# Patient Record
Sex: Male | Born: 1985 | Race: White | Hispanic: No | Marital: Married | Smoking: Current every day smoker
Health system: Southern US, Community
[De-identification: ages and names within clinical notes are randomized; demographics above are authoritative.]

## PROBLEM LIST (undated history)

## (undated) HISTORY — PX: FRACTURE SURGERY: SHX138

## (undated) HISTORY — PX: APPENDECTOMY: SHX54

---

## 2011-07-04 ENCOUNTER — Ambulatory Visit: Payer: Self-pay

## 2012-06-16 ENCOUNTER — Ambulatory Visit

## 2012-06-16 ENCOUNTER — Ambulatory Visit (INDEPENDENT_AMBULATORY_CARE_PROVIDER_SITE_OTHER): Admitting: Internal Medicine

## 2012-06-16 VITALS — BP 112/72 | HR 66 | Temp 98.4°F | Resp 16 | Ht 69.5 in | Wt 198.0 lb

## 2012-06-16 DIAGNOSIS — M79609 Pain in unspecified limb: Secondary | ICD-10-CM

## 2012-06-16 DIAGNOSIS — M7750 Other enthesopathy of unspecified foot: Secondary | ICD-10-CM

## 2012-06-16 DIAGNOSIS — M7670 Peroneal tendinitis, unspecified leg: Secondary | ICD-10-CM

## 2012-06-16 DIAGNOSIS — M79671 Pain in right foot: Secondary | ICD-10-CM

## 2012-06-16 MED ORDER — MELOXICAM 7.5 MG PO TABS: 7.5000 mg | ORAL_TABLET | Freq: Two times a day (BID) | ORAL | Status: DC | PRN

## 2012-06-16 NOTE — Patient Instructions (Addendum)
Pt to do ice massage.  Take NSAIDs and continue wearing good supportive shoes.  If pain gets worse RTC or if no improvement in 10 days.

## 2012-06-16 NOTE — Progress Notes (Signed)
   [address] [place] [address]   Phone [phone]  Subjective:    Patient ID: Brendan James, male    DOB: September 06, 1985, 26 y.o.   MRN: [id number]  HPI  Pt presents to clinic with R foot pain for 48h.  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  The pain in on the other foot with some radiation the last day or so around to the achilles tendon.  He has taken no meds and used no ice.  He had no particular injury that he knows of during the run.  Wore his normal running tennis shoes.  He typically will run 2 miles but on Sat he ran 3 miles before he started to develop the pain. No pain when he is sitting still, but it really hurt this am to put on his boot.    Review of Systems  Musculoskeletal: Positive for joint swelling and gait problem (2nd to pain).       Objective:   Physical Exam  Constitutional: He is oriented to person, place, and time. He appears well-developed and well-nourished.  HENT:  Head: Normocephalic and atraumatic.  Right Ear: External ear normal.  Left Ear: External ear normal.  Pulmonary/Chest: Effort normal.  Musculoskeletal:       Feet:  Neurological: He is alert and oriented to person, place, and time.  Skin: Skin is warm and dry.  Psychiatric: He has a normal mood and affect. His behavior is normal. Judgment and thought content normal.    [facility] reading (PRIMARY) by  Dr. Merla Riches.  Normal xray.       Assessment & Plan:   1. Right foot pain  DG Foot Complete Right  2. Peroneal tendonitis  meloxicam (MOBIC) 7.5 MG tablet   Pt to use ice and if pain worsens he should RTC for camwalker but will try conservative treatment at this time.  Paperwork filled out for patient because this happened during a training weekend for reserves.  Answered patients questions.

## 2012-06-16 NOTE — Progress Notes (Signed)
D/w Dr Merla Riches.

## 2012-06-17 ENCOUNTER — Encounter: Payer: Self-pay | Admitting: Internal Medicine

## 2012-10-10 ENCOUNTER — Encounter: Payer: Self-pay | Admitting: Family Medicine

## 2014-07-02 ENCOUNTER — Emergency Department (HOSPITAL_BASED_OUTPATIENT_CLINIC_OR_DEPARTMENT_OTHER)
Admission: EM | Admit: 2014-07-02 | Discharge: 2014-07-02 | Disposition: A | Discharge status: Home / Self Care | Attending: Emergency Medicine | Admitting: Emergency Medicine

## 2014-07-02 ENCOUNTER — Encounter (HOSPITAL_BASED_OUTPATIENT_CLINIC_OR_DEPARTMENT_OTHER): Payer: Self-pay

## 2014-07-02 DIAGNOSIS — Y9289 Other specified places as the place of occurrence of the external cause: Secondary | ICD-10-CM | POA: Insufficient documentation

## 2014-07-02 DIAGNOSIS — Y998 Other external cause status: Secondary | ICD-10-CM | POA: Insufficient documentation

## 2014-07-02 DIAGNOSIS — W260XXA Contact with knife, initial encounter: Secondary | ICD-10-CM | POA: Diagnosis not present

## 2014-07-02 DIAGNOSIS — Z87891 Personal history of nicotine dependence: Secondary | ICD-10-CM | POA: Insufficient documentation

## 2014-07-02 DIAGNOSIS — Y9389 Activity, other specified: Secondary | ICD-10-CM | POA: Insufficient documentation

## 2014-07-02 DIAGNOSIS — S61512A Laceration without foreign body of left wrist, initial encounter: Secondary | ICD-10-CM | POA: Diagnosis present

## 2014-07-02 MED ORDER — LIDOCAINE-EPINEPHRINE (PF) 2 %-1:200000 IJ SOLN
10.0000 mL | Freq: Once | INTRAMUSCULAR | Status: AC
  Administered 2014-07-02: 10 mL
  Filled 2014-07-02: qty 20

## 2014-07-02 NOTE — ED Notes (Signed)
PA at bedside, suturing patient. 

## 2014-07-02 NOTE — ED Provider Notes (Signed)
CSN: [id number]     Arrival date & time 07/02/14  1636 History   First MD Initiated Contact with Patient 07/02/14 1828     Chief Complaint  Patient presents with  . Extremity Laceration     (Consider location/radiation/quality/duration/timing/severity/associated sxs/prior Treatment) HPI Comments: Patient presents today with a laceration to the volar aspect of the left wrist.  He reports that just prior to arrival he cut his wrist with a knife while opening a present.  Bleeding controlled.  Tetanus UTD.  No numbness or tingling.  He reports full ROM of the wrist.  No treatment prior to arrival.    The history is provided by the patient.    History reviewed. No pertinent past medical history. Past Surgical History  Procedure Laterality Date  . Fracture surgery    . Appendectomy     No family history on file. History  Substance Use Topics  . Smoking status: Former Games developermoker  . Smokeless tobacco: Not on file  . Alcohol Use: No    Review of Systems  All other systems reviewed and are negative.     Allergies  Review of patient's allergies indicates no known allergies.  Home Medications   Prior to Admission medications   Medication Sig Start Date End Date Taking? Authorizing Provider  meloxicam (MOBIC) 7.5 MG tablet Take 1 tablet (7.5 mg total) by mouth 2 (two) times daily as needed for pain. 06/16/12   Morrell RiddleSarah L Weber, PA-C   BP 124/66 mmHg  Pulse 61  Temp(Src) 98.4 F (36.9 C) (Oral)  Resp 18  Ht 5\' 7"  (1.702 m)  Wt 185 lb (83.915 kg)  BMI 28.97 kg/m2 Physical Exam  Constitutional: He appears well-developed and well-nourished.  HENT:  Head: Normocephalic and atraumatic.  Neck: Normal range of motion. Neck supple.  Cardiovascular: Normal rate, regular rhythm and normal heart sounds.   Pulses:      Radial pulses are 2+ on the right side, and 2+ on the left side.  Pulmonary/Chest: Effort normal and breath sounds normal.  Musculoskeletal: Normal range of motion.   Left wrist: He exhibits normal range of motion, no tenderness, no bony tenderness and no swelling.  Neurological: He is alert.  Distal sensation of the left fingers intact Muscle strength 5/5 with flexion and extension of the left wrist.    Skin: Skin is warm and dry.  1.5 cm linear superficial laceration of the volar aspect of the left wrist.  Bleeding controlled  Psychiatric: He has a normal mood and affect.  Nursing note and vitals reviewed.   ED Course  Procedures (including critical care time) Labs Review Labs Reviewed - No data to display  Imaging Review No results found.   EKG Interpretation None     LACERATION REPAIR Performed by: Santiago GladLaisure, Altin Sease Authorized by: Santiago GladLaisure, Kalonji Zurawski Consent: Verbal consent obtained. Risks and benefits: risks, benefits and alternatives were discussed Consent given by: patient Patient identity confirmed: provided demographic data Prepped and Draped in normal sterile fashion Wound explored  Laceration Location: left wrist  Laceration Length: 1.5 cm  No Foreign Bodies seen or palpated  Anesthesia: local infiltration  Local anesthetic: lidocaine 2% with epinephrine  Anesthetic total: 2 ml  Irrigation method: syringe Amount of cleaning: standard  Skin closure: 4-0 Prolene  Number of sutures: 2  Technique: Simple interrupted  Patient tolerance: Patient tolerated the procedure well with no immediate complications.  MDM   Final diagnoses:  None   Patient presents today with a laceration of the volar  aspect of his wrist.  Laceration is superficial.  No apparent tendon involvement.  Neurovascularly intact.  Laceration repaired with sutures without difficulty.  His tetanus is UTD.  Stable for discharge.  Return precautions given.      Santiago GladHeather Mansi Tokar, PA-C 07/02/14 2333  Ethelda ChickMartha K Linker, MD 07/02/14 [phone]

## 2014-07-02 NOTE — ED Notes (Signed)
3mm laceration to left wrist. Bleeding controlled. Tetanus UTD

## 2015-09-04 ENCOUNTER — Emergency Department (HOSPITAL_BASED_OUTPATIENT_CLINIC_OR_DEPARTMENT_OTHER)

## 2015-09-04 ENCOUNTER — Emergency Department (HOSPITAL_BASED_OUTPATIENT_CLINIC_OR_DEPARTMENT_OTHER)
Admission: EM | Admit: 2015-09-04 | Discharge: 2015-09-04 | Disposition: A | Discharge status: Home / Self Care | Attending: Emergency Medicine | Admitting: Emergency Medicine

## 2015-09-04 ENCOUNTER — Encounter (HOSPITAL_BASED_OUTPATIENT_CLINIC_OR_DEPARTMENT_OTHER): Payer: Self-pay | Admitting: *Deleted

## 2015-09-04 DIAGNOSIS — S39012A Strain of muscle, fascia and tendon of lower back, initial encounter: Secondary | ICD-10-CM

## 2015-09-04 DIAGNOSIS — S29012A Strain of muscle and tendon of back wall of thorax, initial encounter: Secondary | ICD-10-CM | POA: Insufficient documentation

## 2015-09-04 DIAGNOSIS — Y92410 Unspecified street and highway as the place of occurrence of the external cause: Secondary | ICD-10-CM | POA: Insufficient documentation

## 2015-09-04 DIAGNOSIS — Z87891 Personal history of nicotine dependence: Secondary | ICD-10-CM | POA: Insufficient documentation

## 2015-09-04 DIAGNOSIS — Y998 Other external cause status: Secondary | ICD-10-CM | POA: Insufficient documentation

## 2015-09-04 DIAGNOSIS — Y9389 Activity, other specified: Secondary | ICD-10-CM | POA: Insufficient documentation

## 2015-09-04 MED ORDER — IBUPROFEN 800 MG PO TABS
800.0000 mg | ORAL_TABLET | Freq: Once | ORAL | Status: AC
Start: 2015-09-04 — End: 2015-09-04
  Administered 2015-09-04: 800 mg via ORAL
  Filled 2015-09-04: qty 1

## 2015-09-04 MED ORDER — NAPROXEN 500 MG PO TABS
500.0000 mg | ORAL_TABLET | Freq: Two times a day (BID) | ORAL | Status: DC
Start: 2015-09-04 — End: 2016-10-17
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
## 2015-09-04 NOTE — Discharge Instructions (Signed)
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
## 2015-09-04 NOTE — ED Notes (Signed)
Patient transported to X-ray 

## 2016-10-17 ENCOUNTER — Encounter (HOSPITAL_BASED_OUTPATIENT_CLINIC_OR_DEPARTMENT_OTHER): Payer: Self-pay | Admitting: Emergency Medicine

## 2016-10-17 ENCOUNTER — Emergency Department (HOSPITAL_BASED_OUTPATIENT_CLINIC_OR_DEPARTMENT_OTHER)
Admission: EM | Admit: 2016-10-17 | Discharge: 2016-10-17 | Disposition: A | Discharge status: Home / Self Care | Payer: Non-veteran care | Attending: Emergency Medicine | Admitting: Emergency Medicine

## 2016-10-17 DIAGNOSIS — X58XXXA Exposure to other specified factors, initial encounter: Secondary | ICD-10-CM | POA: Insufficient documentation

## 2016-10-17 DIAGNOSIS — Y9389 Activity, other specified: Secondary | ICD-10-CM | POA: Diagnosis not present

## 2016-10-17 DIAGNOSIS — Z87891 Personal history of nicotine dependence: Secondary | ICD-10-CM | POA: Insufficient documentation

## 2016-10-17 DIAGNOSIS — Y990 Civilian activity done for income or pay: Secondary | ICD-10-CM | POA: Diagnosis not present

## 2016-10-17 DIAGNOSIS — Y929 Unspecified place or not applicable: Secondary | ICD-10-CM | POA: Diagnosis not present

## 2016-10-17 DIAGNOSIS — S29012A Strain of muscle and tendon of back wall of thorax, initial encounter: Secondary | ICD-10-CM | POA: Diagnosis not present

## 2016-10-17 DIAGNOSIS — T148XXA Other injury of unspecified body region, initial encounter: Secondary | ICD-10-CM

## 2016-10-17 DIAGNOSIS — M546 Pain in thoracic spine: Secondary | ICD-10-CM

## 2016-10-17 DIAGNOSIS — S299XXA Unspecified injury of thorax, initial encounter: Secondary | ICD-10-CM | POA: Diagnosis present

## 2016-10-17 LAB — URINALYSIS, ROUTINE W REFLEX MICROSCOPIC
Bilirubin Urine: NEGATIVE
Glucose, UA: NEGATIVE mg/dL
HGB URINE DIPSTICK: NEGATIVE
Ketones, ur: NEGATIVE mg/dL
LEUKOCYTES UA: NEGATIVE
NITRITE: NEGATIVE
PROTEIN: NEGATIVE mg/dL
SPECIFIC GRAVITY, URINE: 1.026 (ref 1.005–1.030)
pH: 5.5 (ref 5.0–8.0)

## 2016-10-17 MED ORDER — METHOCARBAMOL 500 MG PO TABS: 500.0000 mg | ORAL_TABLET | Freq: Two times a day (BID) | ORAL | 0 refills | Status: AC

## 2016-10-17 MED ORDER — NAPROXEN 250 MG PO TABS
500.0000 mg | ORAL_TABLET | Freq: Once | ORAL | Status: DC
  Filled 2016-10-17: qty 2

## 2016-10-17 MED ORDER — NAPROXEN 500 MG PO TABS: 500.0000 mg | ORAL_TABLET | Freq: Two times a day (BID) | ORAL | 0 refills | Status: AC

## 2016-10-17 NOTE — Discharge Instructions (Signed)
Take your medications as prescribed. You may also apply ice and/or heat to affected area for 15-20 minutes 3-4 times daily for additional pain relief. I recommend refraining from doing any heavy lifting, squatting or repetitive movements that exacerbate your symptoms for the next few days. Follow-up with your primary care provider in the next week if your symptoms have not improved. Please return to the Emergency Department if symptoms worsen or new onset of denies fever, numbness, tingling, groin anesthesia, loss of bowel or bladder, weakness, chest pain, abdominal pain, vomiting.

## 2016-10-17 NOTE — ED Triage Notes (Signed)
Mid back pain while working today, denies injury . Had a similar episode last week .

## 2016-10-17 NOTE — ED Provider Notes (Signed)
[facility] Provider Note   CSN: [id number] Arrival date & time: 10/17/16  1727   By signing my name below, I, Talbert Nan, attest that this documentation has been prepared under the direction and in the presence of Melburn Hake, PA-C. Electronically Signed: Talbert Nan, Scribe. 10/17/16. 6:35 PM.    History   Chief Complaint Chief Complaint  Patient presents with  . Back Pain    HPI Brendan James is a 31 y.o. male who presents to the Emergency Department complaining of sudden onset, cramping constant bilateral mid back pain that began around 8 am this morning while he was unlatching the straps off his truck at work. Pt states that movement exacerbates the pain. 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 Pt has not tried any medications PTA. Pt has had pain like this before that occurred when he was doing the same thing before, but resolved. Pt reports no recent falls, trauma, heavy lifting, exercise. Pt denies fever, numbness, tingling, saddle anesthesia, loss of bowel or bladder, dysuria, blood in urine, N/V/D, abdominal pain, chest pain, weakness, IVDU, cancer or recent spinal manipulation. Denies taking any meds PTA.   The history is provided by the patient. No language interpreter was used.    History reviewed. No pertinent past medical history.  There are no active problems to display for this patient.   Past Surgical History:  Procedure Laterality Date  . APPENDECTOMY    . FRACTURE SURGERY         Home Medications    Prior to Admission medications   Medication Sig Start Date End Date Taking? Authorizing Provider  methocarbamol (ROBAXIN) 500 MG tablet Take 1 tablet (500 mg total) by mouth 2 (two) times daily. 10/17/16   Barrett Henle, PA-C  naproxen (NAPROSYN) 500 MG tablet Take 1 tablet (500 mg total) by mouth 2 (two) times daily. 10/17/16   Barrett Henle, PA-C     Family History No family history on file.  Social History Social History  Substance Use Topics  . Smoking status: Former Smoker    Types: E-cigarettes  . Smokeless tobacco: Never Used  . Alcohol use No     Allergies   Patient has no known allergies.   Review of Systems Review of Systems  Respiratory: Positive for shortness of breath.   Gastrointestinal: Negative for diarrhea, nausea and vomiting.  Musculoskeletal: Positive for back pain.     Physical Exam Updated Vital Signs BP (!) 141/78 (BP Location: Right Arm)   Pulse 79   Temp 98.8 F (37.1 C) (Oral)   Resp 18   Ht  (1.702 m)   Wt 90.7 kg   SpO2 99%   BMI 31.32 kg/m   Physical Exam  Constitutional: He is oriented to person, place, and time. He appears well-developed and well-nourished. No distress.  HENT:  Head: Normocephalic and atraumatic.  Eyes: Conjunctivae and EOM are normal. Right eye exhibits no discharge. Left eye exhibits no discharge. No scleral icterus.  Neck: Normal range of motion. Neck supple.  Cardiovascular: Normal rate, regular rhythm, normal heart sounds and intact distal pulses.   Pulmonary/Chest: Effort normal and breath sounds normal. No respiratory distress. He has no wheezes. He has no rales. He exhibits no tenderness.  Abdominal: Soft. Bowel sounds are normal. He exhibits no distension. There is no tenderness.  No CVA tenderness.  Musculoskeletal: Normal range of motion. He exhibits no edema,  tenderness or deformity.  No midline C, T, or L tenderness. Full range of motion of neck and back. Full range of motion of bilateral upper and lower extremities, with 5/5 strength. Sensation intact. 2+ radial and PT pulses. Cap refill <2 seconds. Patient able to stand and ambulate without assistance.    Neurological: He is alert and oriented to person, place, and time. He has normal strength. He displays normal reflexes. No sensory deficit.  Skin: Skin is warm and dry. Capillary refill  takes less than 2 seconds. He is not diaphoretic.  Psychiatric: He has a normal mood and affect.  Nursing note and vitals reviewed.    ED Treatments / Results   DIAGNOSTIC STUDIES: Oxygen Saturation is 99% on room air, normal by my interpretation.    COORDINATION OF CARE: 6:30 PM Discussed treatment plan with pt at bedside and pt agreed to plan, which includes muscle relaxant and antiinflammatory. Follow up with family doctor.    Labs (all labs ordered are listed, but only abnormal results are displayed) Labs Reviewed  URINALYSIS, ROUTINE W REFLEX MICROSCOPIC    EKG  EKG Interpretation None       Radiology No results found.  Procedures Procedures (including critical care time)  Medications Ordered in ED Medications  naproxen (NAPROSYN) tablet 500 mg (not administered)     Initial Impression / Assessment and Plan / ED Course  I have reviewed the triage vital signs and the nursing notes.  Pertinent labs & imaging results that were available during my care of the patient were reviewed by me and considered in my medical decision making (see chart for details).       Final Clinical Impressions(s) / ED Diagnoses   Final diagnoses:  Muscle strain  Acute bilateral thoracic back pain   Patient with back pain.  No neurological deficits and normal neuro exam.  Patient can walk but states is painful.  No loss of bowel or bladder control.  No concern for cauda equina. UA unremarkable, no CVA tenderness, no concern for kidney stones or pyelonephritis. No fever, night sweats, weight loss, h/o cancer, IVDU. Suspect patient's symptoms are likely due to muscle strain associated with recent activity.  RICE protocol and pain medicine indicated and discussed with patient.  Discussed or return precautions. Advised patient to follow up with PCP.  New Prescriptions New Prescriptions   METHOCARBAMOL (ROBAXIN) 500 MG TABLET    Take 1 tablet (500 mg total) by mouth 2 (two) times  daily.   NAPROXEN (NAPROSYN) 500 MG TABLET    Take 1 tablet (500 mg total) by mouth 2 (two) times daily.   I personally performed the services described in this documentation, which was scribed in my presence. The recorded information has been reviewed and is accurate.     Satira Sark Cowden, [place] 10/17/16 1841    Jerelyn Scott, MD 10/17/16 1843

## 2020-05-29 ENCOUNTER — Emergency Department (HOSPITAL_BASED_OUTPATIENT_CLINIC_OR_DEPARTMENT_OTHER): Payer: No Typology Code available for payment source

## 2020-05-29 ENCOUNTER — Other Ambulatory Visit: Payer: Self-pay

## 2020-05-29 ENCOUNTER — Emergency Department (HOSPITAL_BASED_OUTPATIENT_CLINIC_OR_DEPARTMENT_OTHER)
Admission: EM | Admit: 2020-05-29 | Discharge: 2020-05-29 | Disposition: A | Discharge status: Home / Self Care | Payer: No Typology Code available for payment source | Attending: Emergency Medicine | Admitting: Emergency Medicine

## 2020-05-29 ENCOUNTER — Encounter (HOSPITAL_BASED_OUTPATIENT_CLINIC_OR_DEPARTMENT_OTHER): Payer: Self-pay | Admitting: Emergency Medicine

## 2020-05-29 DIAGNOSIS — Y9366 Activity, soccer: Secondary | ICD-10-CM | POA: Insufficient documentation

## 2020-05-29 DIAGNOSIS — F17290 Nicotine dependence, other tobacco product, uncomplicated: Secondary | ICD-10-CM | POA: Insufficient documentation

## 2020-05-29 DIAGNOSIS — S99912A Unspecified injury of left ankle, initial encounter: Secondary | ICD-10-CM | POA: Diagnosis present

## 2020-05-29 DIAGNOSIS — X501XXA Overexertion from prolonged static or awkward postures, initial encounter: Secondary | ICD-10-CM | POA: Diagnosis not present

## 2020-05-29 NOTE — ED Notes (Signed)
Patient transported to X-ray in wheelchair 

## 2020-05-29 NOTE — Discharge Instructions (Signed)
You have been seen today for an ankle injury. There were no acute abnormalities on the x-rays, including no sign of fracture or dislocation, however, there could be injuries to the soft tissues, such as the ligaments or tendons that are not seen on xrays. There could also be what are called occult fractures that are small fractures not seen on xray. °Antiinflammatory medications: Take 600 mg of ibuprofen every 6 hours or 440 mg (over the counter dose) to 500 mg (prescription dose) of naproxen every 12 hours for the next 3 days. After this time, these medications may be used as needed for pain. Take these medications with food to avoid upset stomach. Choose only one of these medications, do not take them together. °Acetaminophen (generic for Tylenol): Should you continue to have additional pain while taking the ibuprofen or naproxen, you may add in acetaminophen as needed. Your daily total maximum amount of acetaminophen from all sources should be limited to 4000mg/day for persons without liver problems, or 2000mg/day for those with liver problems. °Ice: May apply ice to the area over the next 24 hours for 15 minutes at a time to reduce swelling. °Elevation: Keep the extremity elevated as often as possible to reduce pain and inflammation. °Support: Wear the cam boot for support and comfort. Wear this until pain resolves. You will be weight-bearing as tolerated, which means you can slowly start to put weight on the extremity and increase amount and frequency as pain allows. °Follow up: If symptoms are improving, you may follow up with your primary care provider for any continued management. If symptoms are not starting to improve within a week, you should follow up with the orthopedic specialist within two weeks. °Return: Return to the ED for numbness, weakness, increasing pain, overall worsening symptoms, loss of function, or if symptoms are not improving, you have tried to follow up with the orthopedic specialist,  and have been unable to do so. ° °For prescription assistance, may try using prescription discount sites or apps, such as goodrx.com or Good Rx smart phone app. °

## 2020-05-29 NOTE — ED Provider Notes (Signed)
[facility] EMERGENCY DEPARTMENT Provider Note   CSN: [id number] Arrival date & time: 05/29/20  1357     History Chief Complaint  Patient presents with  . Ankle Pain    Brendan James is a 34 y.o. male.  HPI      Brendan James is a 34 y.o. male, presenting to the ED with injury to left ankle that occurred shortly prior to arrival.  Patient states he was playing soccer with his child and twisted his left ankle. He endorses throbbing, moderate pain primarily to the lateral ankle with associated swelling.  Denies other injuries, numbness, weakness.    History reviewed. No pertinent past medical history.  There are no problems to display for this patient.   Past Surgical History:  Procedure Laterality Date  . APPENDECTOMY    . FRACTURE SURGERY         No family history on file.  Social History   Tobacco Use  . Smoking status: Current Every Day Smoker    Types: E-cigarettes  . Smokeless tobacco: Never Used  Vaping Use  . Vaping Use: Every day  Substance Use Topics  . Alcohol use: No  . Drug use: No    Home Medications Prior to Admission medications   Medication Sig Start Date End Date Taking? Authorizing Provider  methocarbamol (ROBAXIN) 500 MG tablet Take 1 tablet (500 mg total) by mouth 2 (two) times daily. 10/17/16   Barrett Henle, PA-C  naproxen (NAPROSYN) 500 MG tablet Take 1 tablet (500 mg total) by mouth 2 (two) times daily. 10/17/16   Barrett Henle, PA-C    Allergies    Patient has no known allergies.  Review of Systems   Review of Systems  Musculoskeletal: Positive for arthralgias and joint swelling.  Neurological: Negative for weakness and numbness.    Physical Exam Updated Vital Signs BP 120/74 (BP Location: Right Arm)   Pulse 75   Temp 98.8 F (37.1 C) (Oral)   Resp 18   Ht 5\' 7"  (1.702 m)   Wt 97.5 kg   SpO2 97%   BMI 33.67 kg/m   Physical Exam Vitals and nursing note reviewed.   Constitutional:      General: He is not in acute distress.    Appearance: He is well-developed. He is not diaphoretic.  HENT:     Head: Normocephalic and atraumatic.  Eyes:     Conjunctiva/sclera: Conjunctivae normal.  Cardiovascular:     Rate and Rhythm: Normal rate and regular rhythm.     Pulses:          Dorsalis pedis pulses are 2+ on the left side.       Posterior tibial pulses are 2+ on the left side.  Pulmonary:     Effort: Pulmonary effort is normal.  Musculoskeletal:     Cervical back: Neck supple.     Comments: Tenderness and swelling over the left lateral malleolus.  No noted deformity or instability. Flexion and extension at the ankle intact. No tenderness into the foot, knee, or the rest of the lower leg.  Skin:    General: Skin is warm and dry.     Capillary Refill: Capillary refill takes less than 2 seconds.     Coloration: Skin is not pale.  Neurological:     Mental Status: He is alert.     Comments: Sensation to light touch grossly intact in the left foot and toes. Motor function intact.  Psychiatric:  Behavior: Behavior normal.     ED Results / Procedures / Treatments   Labs (all labs ordered are listed, but only abnormal results are displayed) Labs Reviewed - No data to display  EKG None  Radiology DG Ankle Complete Left  Result Date: 05/29/2020 CLINICAL DATA:  Ankle injury EXAM: LEFT ANKLE COMPLETE - 3+ VIEW COMPARISON:  None. FINDINGS: Prominent soft tissue swelling over the lateral ankle. Ankle mortise is symmetric. No definite acute displaced fracture or malalignment. IMPRESSION: Soft tissue swelling. No definite acute osseous abnormality. Electronically Signed   By: Jasmine Pang M.D.   On: 05/29/2020 15:03    Procedures Procedures (including critical care time)  Medications Ordered in ED Medications - No data to display  ED Course  I have reviewed the triage vital signs and the nursing notes.  Pertinent labs & imaging results  that were available during my care of the patient were reviewed by me and considered in my medical decision making (see chart for details).    MDM Rules/Calculators/A&P                          Patient presents with a left ankle injury.  No evidence of neurovascular compromise. I personally reviewed and interpreted the patient's x-rays.  No evidence of acute osseous abnormality.  Discussed the possibility for other injuries not visible on x-ray. Patient states he will follow-up with orthopedics through the [place].  He has a cam boot and crutches at home.  Declined these items here.  The patient was given instructions for home care as well as return precautions. Patient voices understanding of these instructions, accepts the plan, and is comfortable with discharge.   Final Clinical Impression(s) / ED Diagnoses Final diagnoses:  Injury of left ankle, initial encounter    Rx / DC Orders ED Discharge Orders    None       Concepcion Living 05/29/20 1603    Arby Barrette, MD 05/30/20 [phone]

## 2020-05-29 NOTE — ED Triage Notes (Signed)
Pt c/o left ankle injury while playing soccer with child. Pt has history of fractures in same ankle.

## 2020-05-29 NOTE — ED Notes (Addendum)
Pt discharged to home. Discharge instructions have been discussed with patient and/or family members. Pt verbally acknowledges understanding d/c instructions, and endorses comprehension to checkout at registration before leaving. Pt declines haviang v/s taken for discharge, states "I dont see a need for that".

## 2020-07-01 ENCOUNTER — Encounter (HOSPITAL_BASED_OUTPATIENT_CLINIC_OR_DEPARTMENT_OTHER): Payer: Self-pay | Admitting: Emergency Medicine

## 2020-07-01 ENCOUNTER — Emergency Department (HOSPITAL_BASED_OUTPATIENT_CLINIC_OR_DEPARTMENT_OTHER): Payer: No Typology Code available for payment source

## 2020-07-01 ENCOUNTER — Emergency Department (HOSPITAL_BASED_OUTPATIENT_CLINIC_OR_DEPARTMENT_OTHER)
Admission: EM | Admit: 2020-07-01 | Discharge: 2020-07-01 | Disposition: A | Discharge status: Home / Self Care | Payer: No Typology Code available for payment source | Attending: Emergency Medicine | Admitting: Emergency Medicine

## 2020-07-01 ENCOUNTER — Other Ambulatory Visit: Payer: Self-pay

## 2020-07-01 DIAGNOSIS — R52 Pain, unspecified: Secondary | ICD-10-CM

## 2020-07-01 DIAGNOSIS — X501XXA Overexertion from prolonged static or awkward postures, initial encounter: Secondary | ICD-10-CM | POA: Diagnosis not present

## 2020-07-01 DIAGNOSIS — S93401A Sprain of unspecified ligament of right ankle, initial encounter: Secondary | ICD-10-CM | POA: Insufficient documentation

## 2020-07-01 DIAGNOSIS — S99911A Unspecified injury of right ankle, initial encounter: Secondary | ICD-10-CM | POA: Diagnosis present

## 2020-07-01 DIAGNOSIS — F17210 Nicotine dependence, cigarettes, uncomplicated: Secondary | ICD-10-CM | POA: Diagnosis not present

## 2020-07-01 MED ORDER — IBUPROFEN 800 MG PO TABS
800.0000 mg | ORAL_TABLET | Freq: Once | ORAL | Status: AC
  Administered 2020-07-01: 800 mg via ORAL
  Filled 2020-07-01: qty 1

## 2020-07-01 NOTE — ED Triage Notes (Signed)
Right ankle injury today, pt rolled ankle.

## 2020-07-01 NOTE — Discharge Instructions (Signed)
Please read instructions below. Apply ice to your ankle for 20 minutes at a time.  Elevate it as much as possible to help with swelling. Avoid weightbearing for minimum 1 week to allow proper healing. You can take ibuprofen every 6 hours as needed for pain. Schedule an appointment with the sports medicine specialist in 2 weeks to follow-up on your injury.  Return to the ER for new or concerning symptoms.

## 2020-07-01 NOTE — ED Notes (Signed)
Pedal pulses at 2 plus to right foot, wiggles toes without difficulty, not able to bear weight on right foot, cap refill wnl, no acute distress, obvious swelling to right lateral ankle

## 2020-07-01 NOTE — ED Notes (Signed)
Ankle immobilized and ice applied

## 2020-07-01 NOTE — ED Provider Notes (Signed)
[facility] EMERGENCY DEPARTMENT Provider Note   CSN: [id number] Arrival date & time: 07/01/20  1639     History Chief Complaint  Patient presents with  . Ankle Pain    Brendan James is a 34 y.o. male patient presenting for sudden onset of right ankle pain and swelling after rolling his ankle while playing with his children prior to arrival.  He has pain to the lateral aspect, unable to range his ankle secondary to pain.  No prior injuries to right ankle.  No pain to his lower leg or distal foot.  No wounds or other injuries.  The history is provided by the patient.       No past medical history on file.  There are no problems to display for this patient.   Past Surgical History:  Procedure Laterality Date  . APPENDECTOMY    . FRACTURE SURGERY         No family history on file.  Social History   Tobacco Use  . Smoking status: Current Every Day Smoker    Types: E-cigarettes  . Smokeless tobacco: Never Used  Vaping Use  . Vaping Use: Every day  Substance Use Topics  . Alcohol use: No  . Drug use: No    Home Medications Prior to Admission medications   Medication Sig Start Date End Date Taking? Authorizing Provider  methocarbamol (ROBAXIN) 500 MG tablet Take 1 tablet (500 mg total) by mouth 2 (two) times daily. 10/17/16   Barrett Henle, PA-C  naproxen (NAPROSYN) 500 MG tablet Take 1 tablet (500 mg total) by mouth 2 (two) times daily. 10/17/16   Barrett Henle, PA-C    Allergies    Patient has no known allergies.  Review of Systems   Review of Systems  Musculoskeletal: Positive for arthralgias and joint swelling.  Skin: Negative for wound.  Neurological: Negative for numbness.    Physical Exam Updated Vital Signs BP 125/79 (BP Location: Right Arm)   Pulse 83   Temp 98.4 F (36.9 C) (Oral)   Resp 18   Ht 5\' 7"  (1.702 m)   Wt 95.3 kg   SpO2 97%   BMI 32.89 kg/m   Physical Exam Vitals and nursing note reviewed.   Constitutional:      General: He is not in acute distress.    Appearance: He is well-developed.  HENT:     Head: Normocephalic and atraumatic.  Eyes:     Conjunctiva/sclera: Conjunctivae normal.  Cardiovascular:     Rate and Rhythm: Normal rate.  Pulmonary:     Effort: Pulmonary effort is normal.  Musculoskeletal:     Comments: Right ankle with localized swelling to the lateral aspect.  No deformity noted.  No wounds.  Tenderness to the lateral aspect.  Achilles is intact.  Unable to range his ankle secondary to pain.  Neurological:     Mental Status: He is alert.  Psychiatric:        Mood and Affect: Mood normal.        Behavior: Behavior normal.     ED Results / Procedures / Treatments   Labs (all labs ordered are listed, but only abnormal results are displayed) Labs Reviewed - No data to display  EKG None  Radiology DG Ankle Complete Right  Result Date: 07/01/2020 CLINICAL DATA:  Jumped and rolled ankle, pain and swelling and lateral ankle and over metatarsals EXAM: RIGHT ANKLE - COMPLETE 3+ VIEW; RIGHT FOOT COMPLETE - 3+ VIEW COMPARISON:  None.  FINDINGS: No fracture or dislocation of the right foot or right ankle. Joint spaces are well preserved. There is extensive soft tissue edema over the lateral ankle. IMPRESSION: 1. No fracture or dislocation of the right foot or right ankle. Joint spaces are well preserved. 2. Extensive soft tissue edema over the lateral ankle. Electronically Signed   By: Lauralyn Primes M.D.   On: 07/01/2020 17:51   DG Foot Complete Right  Result Date: 07/01/2020 CLINICAL DATA:  Jumped and rolled ankle, pain and swelling and lateral ankle and over metatarsals EXAM: RIGHT ANKLE - COMPLETE 3+ VIEW; RIGHT FOOT COMPLETE - 3+ VIEW COMPARISON:  None. FINDINGS: No fracture or dislocation of the right foot or right ankle. Joint spaces are well preserved. There is extensive soft tissue edema over the lateral ankle. IMPRESSION: 1. No fracture or dislocation of  the right foot or right ankle. Joint spaces are well preserved. 2. Extensive soft tissue edema over the lateral ankle. Electronically Signed   By: Lauralyn Primes M.D.   On: 07/01/2020 17:51    Procedures Procedures (including critical care time)  Medications Ordered in ED Medications  ibuprofen (ADVIL) tablet 800 mg (800 mg Oral Given 07/01/20 1657)    ED Course  I have reviewed the triage vital signs and the nursing notes.  Pertinent labs & imaging results that were available during my care of the patient were reviewed by me and considered in my medical decision making (see chart for details).    MDM Rules/Calculators/A&P                         Patient with right ankle injury, likely sprain, after rolling his ankle while playing with his children prior to arrival.  X-rays negative for acute fracture.  Neurovascular intact, no wounds.  Will treat as sprain with immobilization, nonweightbearing with crutches.  Recommend elevation, ice, compression, NSAIDs for pain.  Sports medicine referral provided for follow-up.  Discussed results, findings, treatment and follow up. Patient advised of return precautions. Patient verbalized understanding and agreed with plan.  Final Clinical Impression(s) / ED Diagnoses Final diagnoses:  Sprain of right ankle, unspecified ligament, initial encounter    Rx / DC Orders ED Discharge Orders    None       Cartina Brousseau, [place] N, PA-C 07/01/20 1810    Pricilla Loveless, MD 07/01/20 2142

## 2020-07-12 ENCOUNTER — Telehealth: Payer: Self-pay | Admitting: Family Medicine

## 2020-07-12 NOTE — Telephone Encounter (Signed)
Called pt to offer ED follow up appt for ankle injury--no answer--msg left.  --glh

## 2022-01-05 ENCOUNTER — Emergency Department (HOSPITAL_BASED_OUTPATIENT_CLINIC_OR_DEPARTMENT_OTHER)
Admission: EM | Admit: 2022-01-05 | Discharge: 2022-01-05 | Disposition: A | Discharge status: Home / Self Care | Payer: No Typology Code available for payment source | Attending: Emergency Medicine | Admitting: Emergency Medicine

## 2022-01-05 ENCOUNTER — Encounter (HOSPITAL_BASED_OUTPATIENT_CLINIC_OR_DEPARTMENT_OTHER): Payer: Self-pay | Admitting: Emergency Medicine

## 2022-01-05 ENCOUNTER — Other Ambulatory Visit: Payer: Self-pay

## 2022-01-05 DIAGNOSIS — L500 Allergic urticaria: Secondary | ICD-10-CM | POA: Diagnosis not present

## 2022-01-05 DIAGNOSIS — T7840XA Allergy, unspecified, initial encounter: Secondary | ICD-10-CM

## 2022-01-05 DIAGNOSIS — L299 Pruritus, unspecified: Secondary | ICD-10-CM | POA: Diagnosis present

## 2022-01-05 MED ORDER — DIPHENHYDRAMINE HCL 50 MG/ML IJ SOLN
12.5000 mg | Freq: Once | INTRAMUSCULAR | Status: AC
  Administered 2022-01-05: 12.5 mg via INTRAVENOUS
  Filled 2022-01-05: qty 1

## 2022-01-05 MED ORDER — DEXAMETHASONE SODIUM PHOSPHATE 10 MG/ML IJ SOLN
10.0000 mg | Freq: Once | INTRAMUSCULAR | Status: AC
  Administered 2022-01-05: 10 mg via INTRAVENOUS
  Filled 2022-01-05: qty 1

## 2022-01-05 MED ORDER — SODIUM CHLORIDE 0.9 % IV BOLUS
1000.0000 mL | Freq: Once | INTRAVENOUS | Status: AC
  Administered 2022-01-05: 1000 mL via INTRAVENOUS

## 2022-01-05 MED ORDER — EPINEPHRINE 0.3 MG/0.3ML IJ SOAJ: 0.3000 mg | INTRAMUSCULAR | 0 refills | Status: AC | PRN

## 2022-01-05 MED ORDER — FAMOTIDINE IN NACL 20-0.9 MG/50ML-% IV SOLN
20.0000 mg | Freq: Once | INTRAVENOUS | Status: AC
  Administered 2022-01-05: 20 mg via INTRAVENOUS
  Filled 2022-01-05: qty 50

## 2022-01-05 NOTE — ED Provider Notes (Signed)
[facility] EMERGENCY DEPARTMENT Provider Note   CSN: [id number] Arrival date & time: 01/05/22  1425     History  Chief Complaint  Patient presents with   Insect Bite    Brendan James is a 36 y.o. male with medical history significant for appendectomy, bee allergy. Patient reports to ED for evaluation of yellow jacket sting. Patient reports that 40 minutes PTA he was stung by what he believes was a yellow jacket on his testicles. Patient states he is now experiencing itchy hives to bilateral upper thighs, abdomen and bilateral forearms. Patient states he has been stung by bees before and had similar reactions. Patient denies history of anaphylaxis. Patient denies nausea, vomiting, diarrhea, trouble swallowing, shortness of breath, drooling, lip or facial swelling.   HPI     Home Medications Prior to Admission medications   Medication Sig Start Date End Date Taking? Authorizing Provider  EPINEPHrine 0.3 mg/0.3 mL IJ SOAJ injection Inject 0.3 mg into the muscle as needed for anaphylaxis. 01/05/22  Yes Al Decant, PA-C  methocarbamol (ROBAXIN) 500 MG tablet Take 1 tablet (500 mg total) by mouth 2 (two) times daily. 10/17/16   Barrett Henle, PA-C  naproxen (NAPROSYN) 500 MG tablet Take 1 tablet (500 mg total) by mouth 2 (two) times daily. 10/17/16   Barrett Henle, PA-C      Allergies    Patient has no known allergies.    Review of Systems   Review of Systems  HENT:  Negative for drooling and trouble swallowing.   Respiratory:  Negative for shortness of breath.   Gastrointestinal:  Negative for diarrhea, nausea and vomiting.  Skin:  Positive for rash.  All other systems reviewed and are negative.   Physical Exam Updated Vital Signs BP 111/79   Pulse (!) 59   Temp 97.8 F (36.6 C) (Oral)   Resp 18   Ht 5\' 7"  (1.702 m)   Wt 99.3 kg   SpO2 99%   BMI 34.30 kg/m  Physical Exam Vitals and nursing note reviewed.  Constitutional:       General: He is not in acute distress.    Appearance: Normal appearance. He is not ill-appearing, toxic-appearing or diaphoretic.  HENT:     Head: Normocephalic and atraumatic.     Nose: Nose normal. No congestion.     Mouth/Throat:     Mouth: Mucous membranes are moist.     Pharynx: Oropharynx is clear. No oropharyngeal exudate or posterior oropharyngeal erythema.     Comments: Airway patent. No angioedema. Patient phonating appropriately. Handling secretions appropriately. Eyes:     General:        Right eye: No discharge.        Left eye: No discharge.     Extraocular Movements: Extraocular movements intact.     Conjunctiva/sclera: Conjunctivae normal.     Pupils: Pupils are equal, round, and reactive to light.  Cardiovascular:     Rate and Rhythm: Normal rate and regular rhythm.  Pulmonary:     Effort: Pulmonary effort is normal. No respiratory distress.     Breath sounds: Normal breath sounds. No stridor. No wheezing, rhonchi or rales.  Abdominal:     General: Abdomen is flat. There is no distension.     Palpations: Abdomen is soft.     Tenderness: There is no abdominal tenderness. There is no guarding.  Musculoskeletal:     Cervical back: Normal range of motion and neck supple. No tenderness.  Skin:  General: Skin is warm and dry.     Capillary Refill: Capillary refill takes less than 2 seconds.     Findings: Rash present. Rash is urticarial.     Comments: Urticarial rash to bilateral upper thighs, abdomen.  Neurological:     Mental Status: He is alert and oriented to person, place, and time.     ED Results / Procedures / Treatments   Labs (all labs ordered are listed, but only abnormal results are displayed) Labs Reviewed - No data to display  EKG None  Radiology No results found.  Procedures Procedures   Medications Ordered in ED Medications  dexamethasone (DECADRON) injection 10 mg (10 mg Intravenous Given 01/05/22 1520)  famotidine (PEPCID) IVPB 20 mg  premix (20 mg Intravenous New Bag/Given 01/05/22 1523)  diphenhydrAMINE (BENADRYL) injection 12.5 mg (12.5 mg Intravenous Given 01/05/22 1520)  sodium chloride 0.9 % bolus 1,000 mL (1,000 mLs Intravenous New Bag/Given 01/05/22 1522)    ED Course/ Medical Decision Making/ A&P                           Medical Decision Making Risk Prescription drug management.   36 year old male presents to the ED for evaluation after being stung by yellow jacket.  Please see HPI for further details.  On examination, the patient is afebrile and nontachycardic.  The patient lung sounds are clear bilaterally, he is not hypoxic on room air, he is not in respiratory distress.  There is no appearance of lip or facial swelling. The patient is phonating appropriately, he is not drooling.  The patient is in no apparent distress.  Patient has bilateral urticarial hives to upper thighs, lower abdomen as well as forearms.  Patient treated with 10 mg Decadron, 12 and half milligrams Benadryl, 20 mg famotidine.  After these medications were administered, the patient had resolution of hives.  Patient was observed for a total time period of 2 hours and 15 minutes the department, he had no return of symptoms during this time.  Patient be discharged home with epinephrine autoinjector pen and advised to follow-up with PCP.  The patient was given return precautions and he voiced understanding with these.  The patient had all of his questions answered to his satisfaction.  The patient is stable at this time for discharge home.  Final Clinical Impression(s) / ED Diagnoses Final diagnoses:  Allergic reaction, initial encounter    Rx / DC Orders ED Discharge Orders          Ordered    EPINEPHrine 0.3 mg/0.3 mL IJ SOAJ injection  As needed        01/05/22 1640              Al Decant, PA-C 01/05/22 1646    Terald Sleeper, MD 01/05/22 [phone]

## 2022-01-05 NOTE — ED Triage Notes (Signed)
Patient presents to ED via POV from work. Patient here post bee sting. Allergy to same. Reports "sometimes I just get hives. Sometimes my throat closes up". Denies any SOB, difficulty breathing or throat closing sensation. Reports itching.

## 2022-01-05 NOTE — Discharge Instructions (Addendum)
Please return to the ED with any new symptoms or recurrence of symptoms such as return of hives, lip or facial swelling, trouble breathing or swallowing Please read attached informational guides concerning how to use autoinjector Please pick up epinephrine autoinjector I sent to the pharmacy we discussed

## 2022-01-26 IMAGING — CR DG ANKLE COMPLETE 3+V*L*
3 series · 3 of 3 positions shown · non-contrast
Comparison: None.

CLINICAL DATA: Ankle injury

EXAM:
LEFT ANKLE COMPLETE - 3+ VIEW

[t ankle joint ap left]
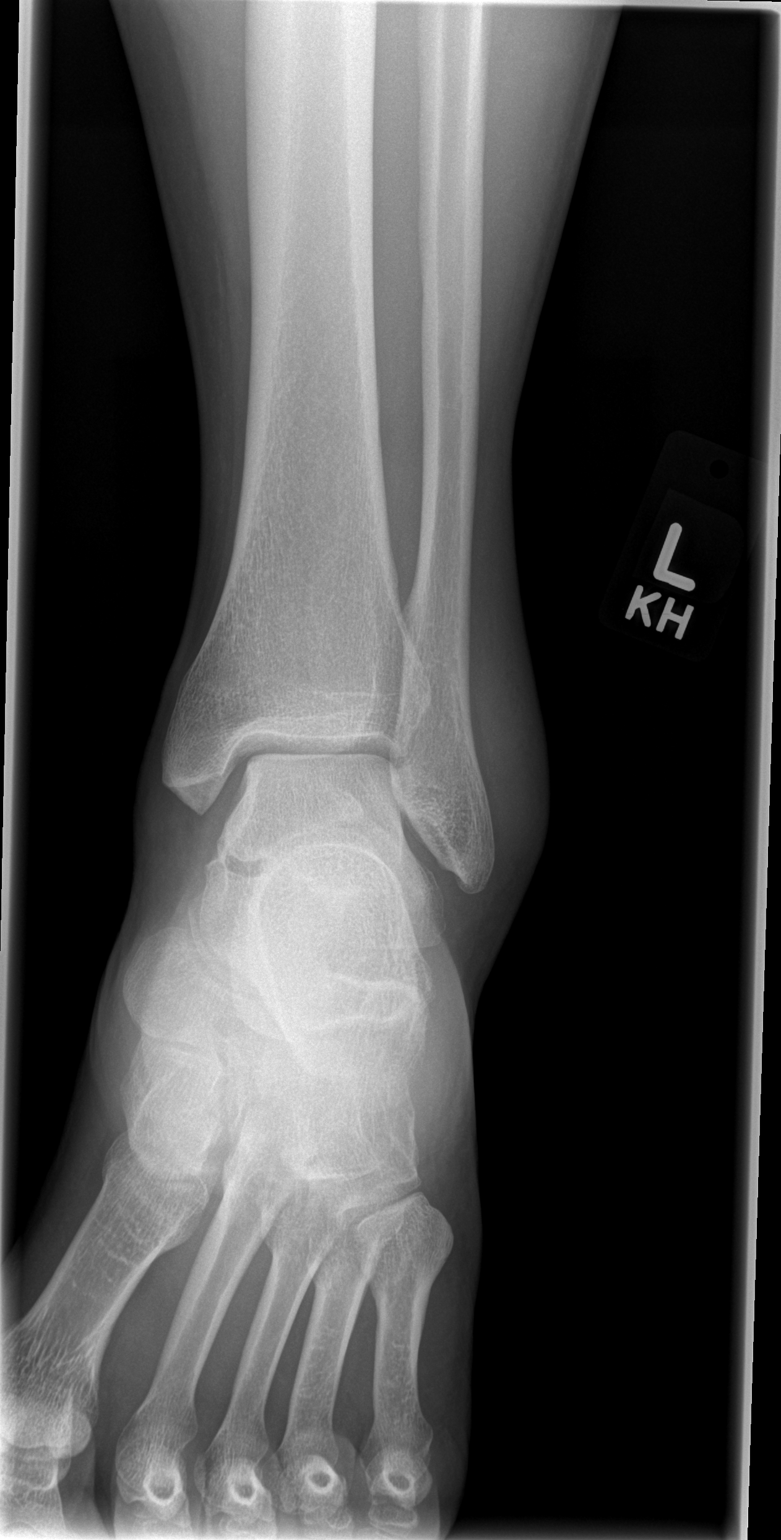

[t ankle joint oblique left]
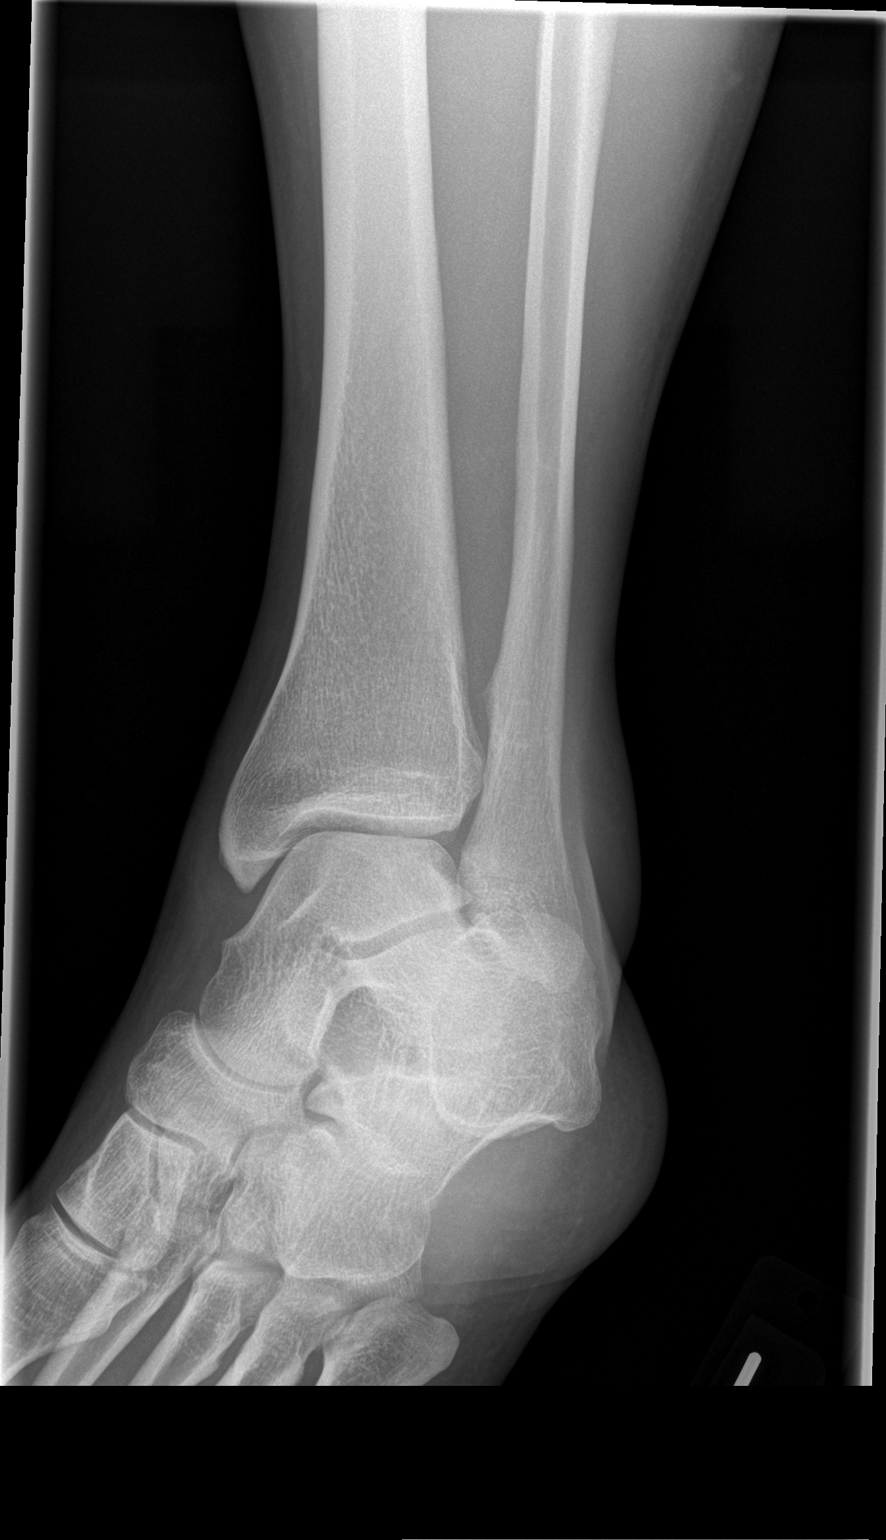

[t ankle joint lat left]
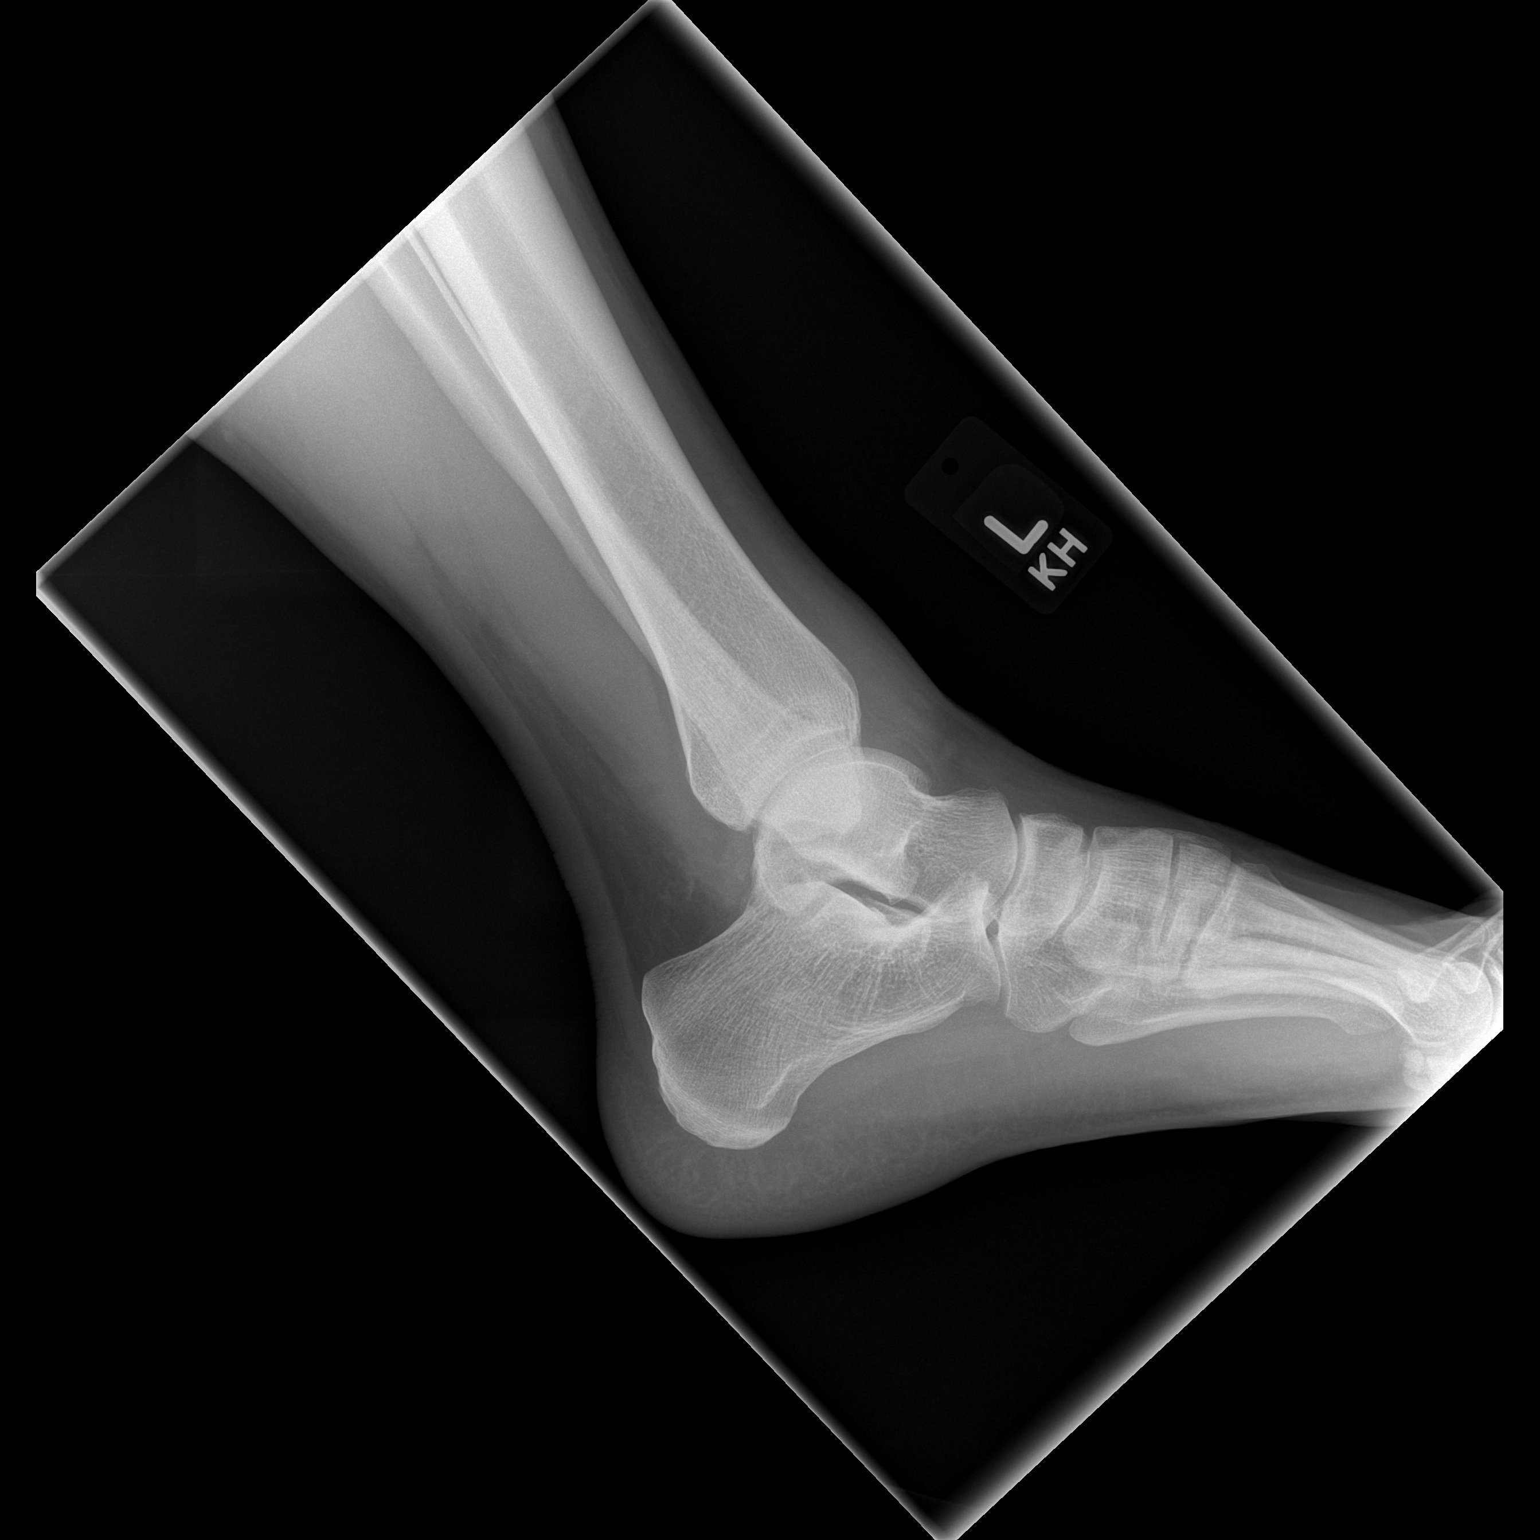

[3 of 3 positions shown; findings below may reference images not displayed]

FINDINGS: Prominent soft tissue swelling over the lateral ankle. Ankle mortise
is symmetric. No definite acute displaced fracture or malalignment.
IMPRESSION: Soft tissue swelling. No definite acute osseous abnormality.

## 2022-02-28 IMAGING — DX DG ANKLE COMPLETE 3+V*R*
3 series · 3 of 3 positions shown · non-contrast
Comparison: None.

CLINICAL DATA: Jumped and rolled ankle, pain and swelling and
lateral ankle and over metatarsals

EXAM:
RIGHT ANKLE - COMPLETE 3+ VIEW; RIGHT FOOT COMPLETE - 3+ VIEW

[ankle ap]
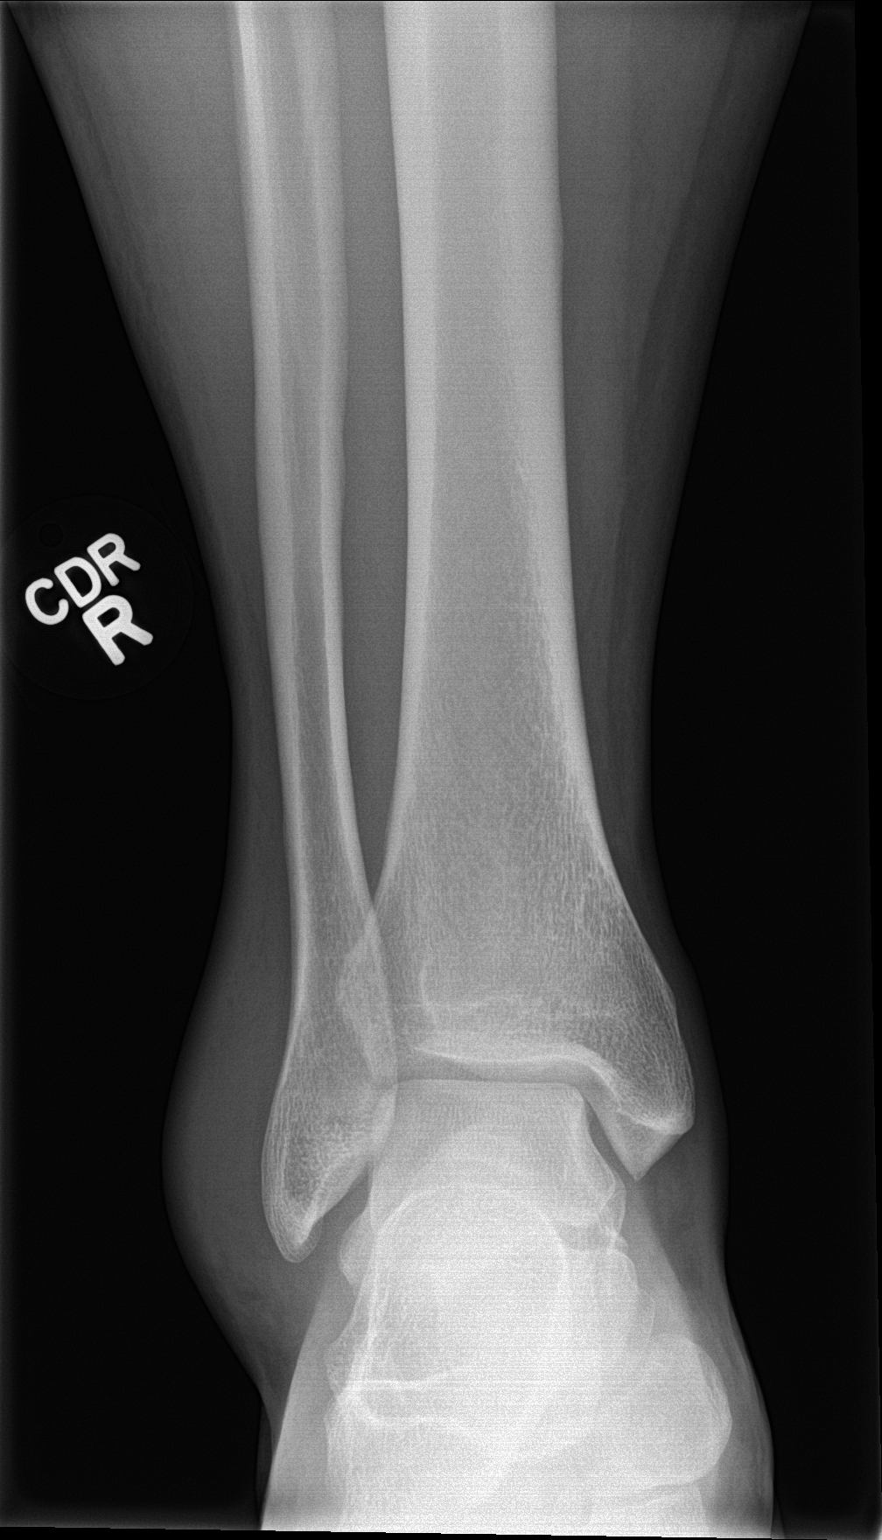

[ankle obl]
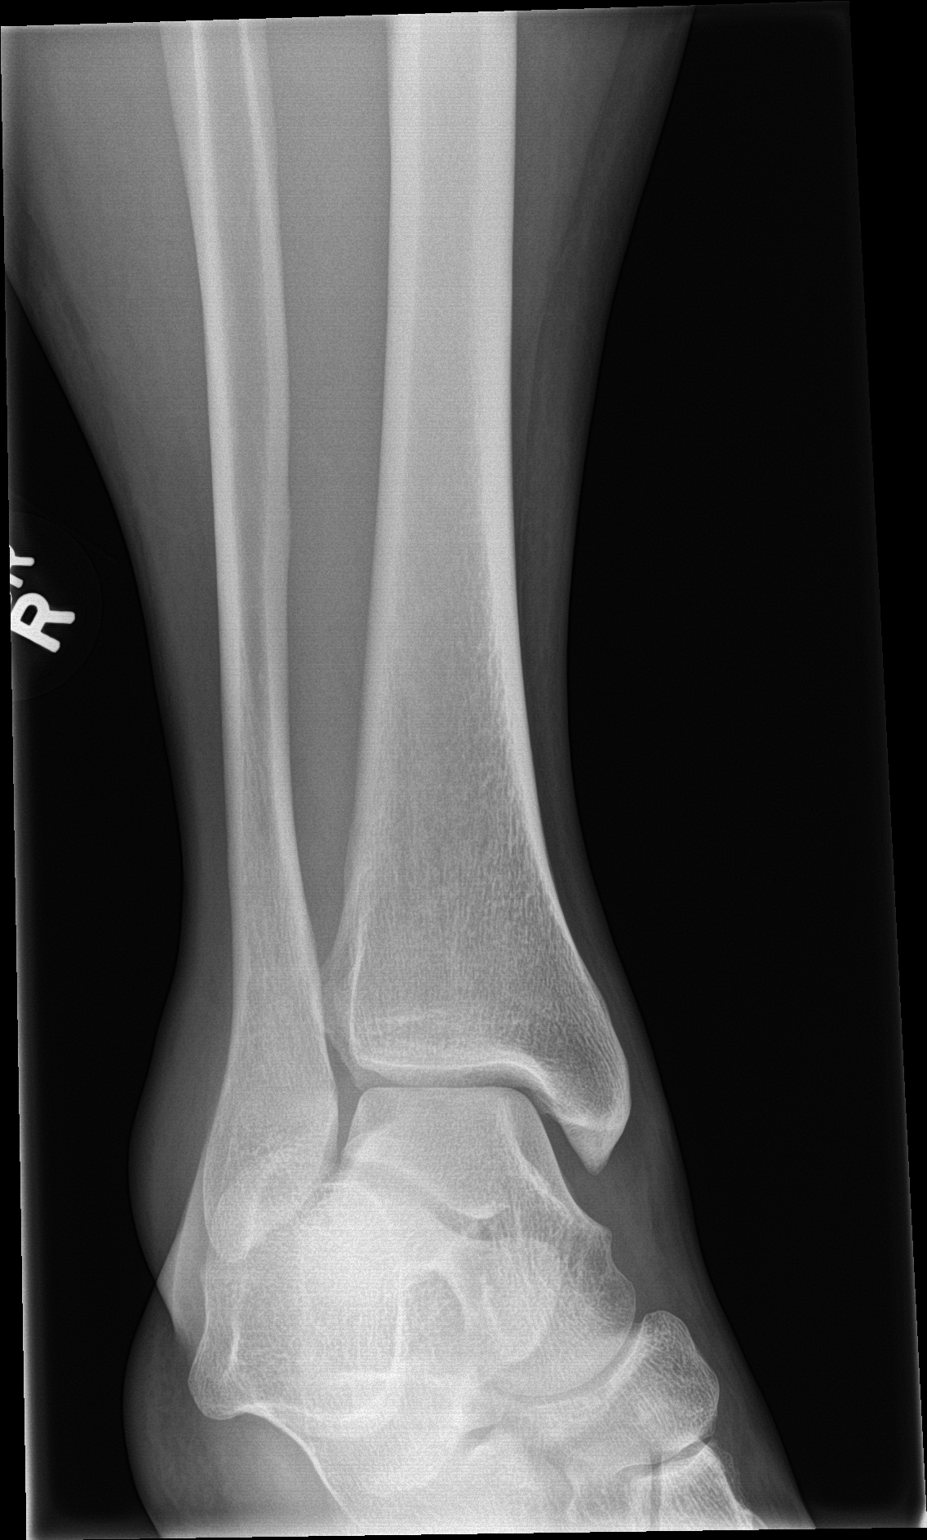

[ankle lat]
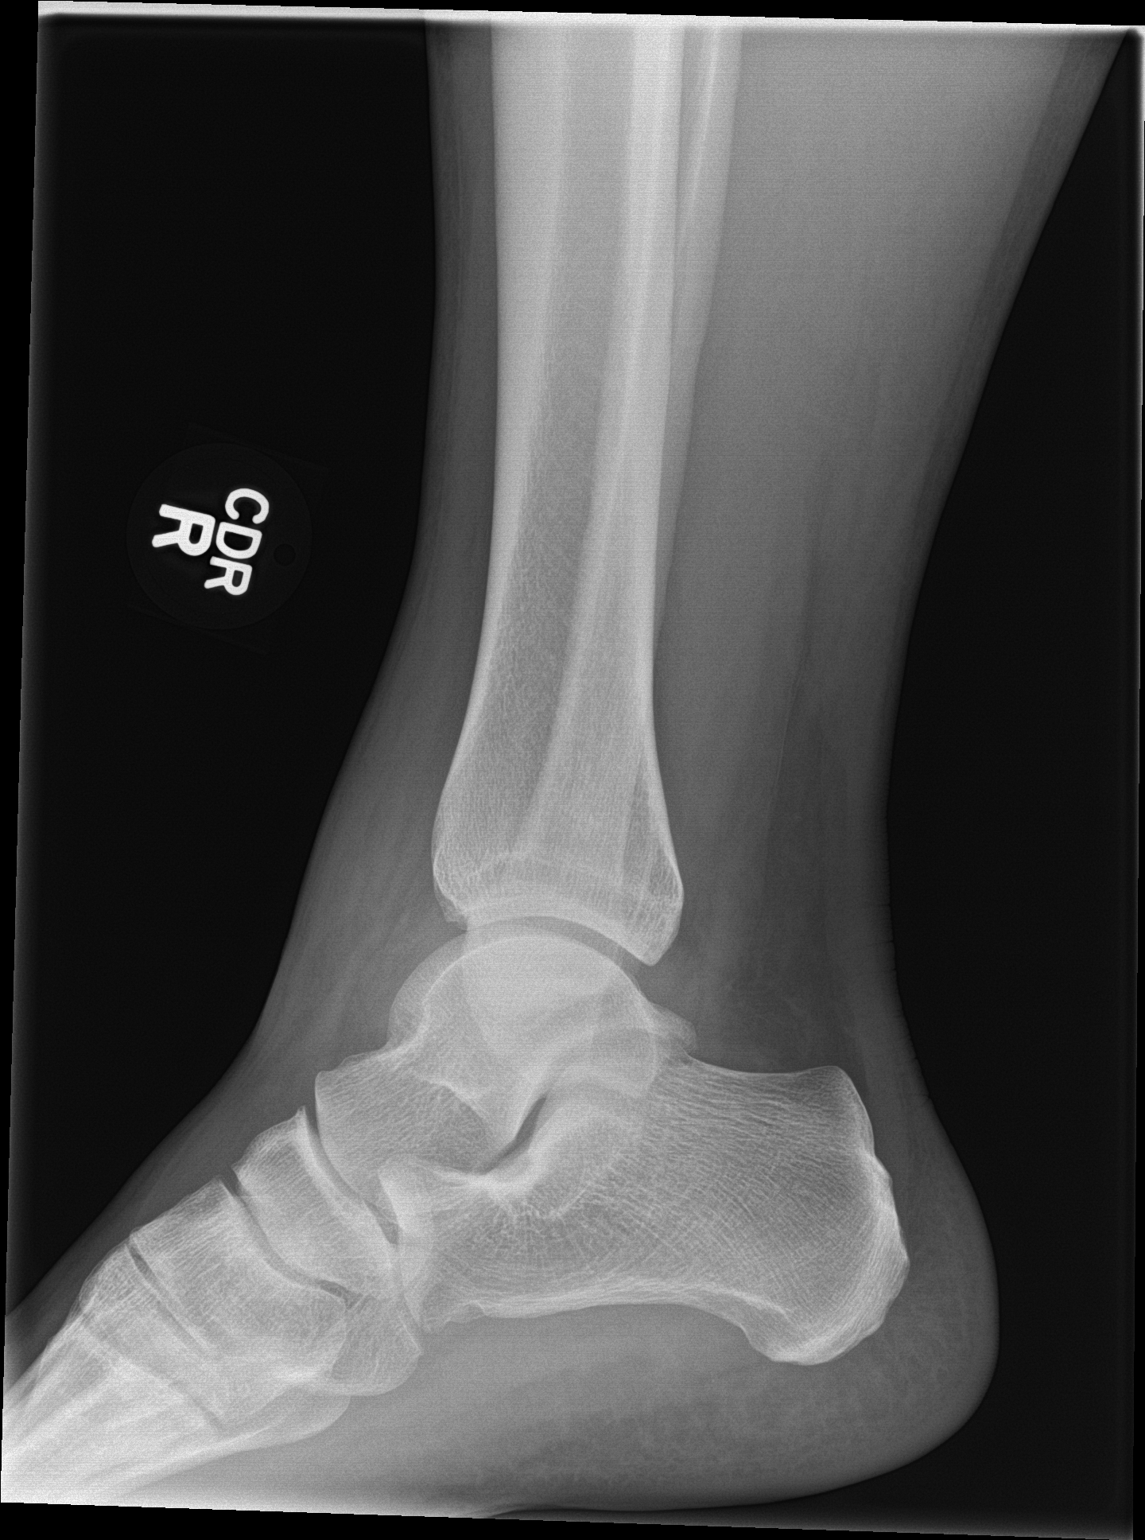

[3 of 3 positions shown; findings below may reference images not displayed]

FINDINGS: No fracture or dislocation of the right foot or right ankle. Joint
spaces are well preserved. There is extensive soft tissue edema over
the lateral ankle.
IMPRESSION: 1. No fracture or dislocation of the right foot or right ankle.
Joint spaces are well preserved.
2. Extensive soft tissue edema over the lateral ankle.

## 2022-10-24 ENCOUNTER — Encounter: Payer: Self-pay | Admitting: *Deleted

## 2024-01-05 ENCOUNTER — Emergency Department (HOSPITAL_BASED_OUTPATIENT_CLINIC_OR_DEPARTMENT_OTHER)
Admission: EM | Admit: 2024-01-05 | Discharge: 2024-01-05 | Disposition: A | Discharge status: Home / Self Care | Attending: Emergency Medicine | Admitting: Emergency Medicine

## 2024-01-05 ENCOUNTER — Encounter (HOSPITAL_BASED_OUTPATIENT_CLINIC_OR_DEPARTMENT_OTHER): Payer: Self-pay

## 2024-01-05 ENCOUNTER — Other Ambulatory Visit: Payer: Self-pay

## 2024-01-05 ENCOUNTER — Emergency Department (HOSPITAL_BASED_OUTPATIENT_CLINIC_OR_DEPARTMENT_OTHER)

## 2024-01-05 DIAGNOSIS — M7051 Other bursitis of knee, right knee: Secondary | ICD-10-CM | POA: Diagnosis not present

## 2024-01-05 DIAGNOSIS — M25561 Pain in right knee: Secondary | ICD-10-CM

## 2024-01-05 DIAGNOSIS — Z720 Tobacco use: Secondary | ICD-10-CM | POA: Insufficient documentation

## 2024-01-05 DIAGNOSIS — Y9302 Activity, running: Secondary | ICD-10-CM | POA: Insufficient documentation

## 2024-01-05 NOTE — Discharge Instructions (Addendum)
 It was a pleasure caring for you today in the emergency department.  Recommend you avoid heavy lifting over the next 2 weeks.  Recommend low impact activity for your knee.  Avoid running or jogging.  Recommend you alternate ibuprofen  and acetaminophen to help with discomfort. Follow up with sports medicine/ PCP  Recommend you avoid tobacco/smoking   Please return to the emergency department for any worsening or worrisome symptoms.

## 2024-01-05 NOTE — ED Triage Notes (Signed)
 Pt reports stabbing pain to right knee for 2-3 weeks after working out. Pain worse w ambulation. Denies swelling. Pt has not tried any NSAIDS for pain Pt running this am and almost collapsed

## 2024-01-05 NOTE — ED Provider Notes (Signed)
 [place] [facility] AT [facility] Provider Note  CSN: [id number] Arrival date & time: 01/05/24 0801  Chief Complaint(s) Knee Pain  HPI Damyen Knoll is a 38 y.o. male with past medical history as below, significant for appendectomy, tobacco use who presents to the ED with complaint of right knee pain  Patient reports pain to his right knee over the past 2 to 3 weeks.  Does not remember any inciting injury.  He has no pain at rest but does have pain when running or weightbearing.  He has not tried any medication at home for the symptoms.  Has not follow-up with PCP regarding symptoms.  No fevers or chills, no IV drug use, no injections or surgery to this knee.  No pain to his ankle or hip.  Past Medical History History reviewed. No pertinent past medical history. There are no active problems to display for this patient.  Home Medication(s) Prior to Admission medications   Medication Sig Start Date End Date Taking? Authorizing Provider  EPINEPHrine  0.3 mg/0.3 mL IJ SOAJ injection Inject 0.3 mg into the muscle as needed for anaphylaxis. 01/05/22   Ruthell Lonni FALCON, PA-C  methocarbamol  (ROBAXIN ) 500 MG tablet Take 1 tablet (500 mg total) by mouth 2 (two) times daily. 10/17/16   Nevelyn Nat Norris, PA-C  naproxen  (NAPROSYN ) 500 MG tablet Take 1 tablet (500 mg total) by mouth 2 (two) times daily. 10/17/16   Nevelyn Nat Norris, PA-C                                                                                                                                    Past Surgical History Past Surgical History:  Procedure Laterality Date   APPENDECTOMY     FRACTURE SURGERY     Family History History reviewed. No pertinent family history.  Social History Social History   Tobacco Use   Smoking status: Every Day    Types: E-cigarettes   Smokeless tobacco: Never  Vaping Use   Vaping status: Every Day  Substance Use Topics   Alcohol use: No   Drug use:  No   Allergies Patient has no known allergies.  Review of Systems A thorough review of systems was obtained and all systems are negative except as noted in the HPI and PMH.   Physical Exam Vital Signs  I have reviewed the triage vital signs BP 113/71   Pulse 79   Temp 98.1 F (36.7 C)   Resp 16   Ht 5' 7 (1.702 m)   Wt 99.8 kg   SpO2 97%   BMI 34.46 kg/m  Physical Exam Vitals and nursing note reviewed.  Constitutional:      General: He is not in acute distress.    Appearance: Normal appearance. He is well-developed. He is not ill-appearing.  HENT:     Head: Normocephalic and atraumatic.     Right Ear: External ear normal.  Left Ear: External ear normal.     Nose: Nose normal.     Mouth/Throat:     Mouth: Mucous membranes are moist.   Eyes:     General: No scleral icterus.       Right eye: No discharge.        Left eye: No discharge.    Cardiovascular:     Rate and Rhythm: Normal rate.  Pulmonary:     Effort: Pulmonary effort is normal. No respiratory distress.     Breath sounds: No stridor.  Abdominal:     General: Abdomen is flat. There is no distension.     Tenderness: There is no guarding.   Musculoskeletal:        General: No deformity.     Cervical back: No rigidity.     Right knee: No swelling, deformity, effusion or crepitus. Normal range of motion. Tenderness present over the medial joint line. No patellar tendon tenderness. No LCL laxity, MCL laxity, ACL laxity or PCL laxity. Normal alignment.     Instability Tests: Anterior drawer test negative. Posterior drawer test negative.     Left knee: Normal. No swelling, deformity or effusion. Normal range of motion. Normal alignment.     Right ankle: Normal. No swelling. No tenderness.     Right Achilles Tendon: Normal.     Left ankle: Normal. No swelling. No tenderness.     Left Achilles Tendon: Normal.       Legs:   Skin:    General: Skin is warm and dry.     Coloration: Skin is not cyanotic,  jaundiced or pale.   Neurological:     Mental Status: He is alert and oriented to person, place, and time.     GCS: GCS eye subscore is 4. GCS verbal subscore is 5. GCS motor subscore is 6.   Psychiatric:        Speech: Speech normal.        Behavior: Behavior normal. Behavior is cooperative.     ED Results and Treatments Labs (all labs ordered are listed, but only abnormal results are displayed) Labs Reviewed - No data to display                                                                                                                        Radiology DG Knee Complete 4 Views Right Result Date: 01/05/2024 CLINICAL DATA:  Stabbing pain for 2-3 weeks after working out. EXAM: RIGHT KNEE - COMPLETE 4+ VIEW COMPARISON:  None Available. FINDINGS: No evidence of fracture, dislocation, or joint effusion. No evidence of arthropathy or other focal bone abnormality. Soft tissues are unremarkable. IMPRESSION: Negative. Electronically Signed   By: Waddell Calk M.D.   On: 01/05/2024 08:34    Pertinent labs & imaging results that were available during my care of the patient were reviewed by me and considered in my medical decision making (see MDM for details).  Medications Ordered in ED Medications - No data  to display                                                                                                                                   Procedures Procedures  (including critical care time)  Medical Decision Making / ED Course    Medical Decision Making:    Barbara Keng is a 38 y.o. male with past medical history as below, significant for appendectomy, tobacco use who presents to the ED with complaint of right knee pain. The complaint involves an extensive differential diagnosis and also carries with it a high risk of complications and morbidity.  Serious etiology was considered. Ddx includes but is not limited to: Tendinitis, bursitis, knees strain, knee sprain, less  likely septic bursitis, etc.  Complete initial physical exam performed, notably the patient was in distress, sitting on side of stretcher.    Reviewed and confirmed nursing documentation for past medical history, family history, social history.  Vital signs reviewed.     Brief summary:  39 year old male w/ hx as above here w/ knee pain Exam is reassuring, he has mild TTP to medial aspect of right knee.  No ligament laxity.  No effusion.  He has full range of motion, LE NVI X-ray negative Possible bursitis or tendinitis.  Advise NSAIDs, no heavy lifting, follow-up with sports medicine or PCP Unlikely septic joint       Patient in no distress and overall condition is stable. Detailed discussions were had with the patient/guardian regarding current findings, and need for close f/u with PCP or on call doctor. The patient/guardian has been instructed to return immediately if the symptoms worsen in any way for re-evaluation. Patient/guardian verbalized understanding and is in agreement with current care plan. All questions answered prior to discharge.              Additional history obtained: -Additional history obtained from na -External records from outside source obtained and reviewed including: Chart review including previous notes, labs, imaging, consultation notes including  Primary care notes, home meds   Lab Tests: na  EKG   EKG Interpretation Date/Time:    Ventricular Rate:    PR Interval:    QRS Duration:    QT Interval:    QTC Calculation:   R Axis:      Text Interpretation:           Imaging Studies ordered: I ordered imaging studies including knee xr I independently visualized the following imaging with scope of interpretation limited to determining acute life threatening conditions related to emergency care; findings noted above I agree with the radiologist interpretation If any imaging was obtained with contrast I closely monitored patient for  any possible adverse reaction a/w contrast administration in the emergency department   Medicines ordered and prescription drug management: No orders of the defined types were placed in this encounter.   -I have reviewed the patients home medicines and have made  adjustments as needed   Consultations Obtained: na   Cardiac Monitoring: Continuous pulse oximetry interpreted by myself, 100% on ra.    Social Determinants of Health:  Diagnosis or treatment significantly limited by social determinants of health: current smoker   Reevaluation: After the interventions noted above, I reevaluated the patient and found that they have stayed the same  Co morbidities that complicate the patient evaluation History reviewed. No pertinent past medical history.    Dispostion: Disposition decision including need for hospitalization was considered, and patient discharged from emergency department.    Final Clinical Impression(s) / ED Diagnoses Final diagnoses:  Pes anserinus bursitis of right knee  Acute pain of right knee        Elnor Jayson LABOR, DO 01/05/24 [id number]
